# Patient Record
Sex: Female | Born: 2000 | Race: Black or African American | Hispanic: No | Marital: Single | Smoking: Never smoker
Health system: Southern US, Community
[De-identification: ages and names within clinical notes are randomized; demographics above are authoritative.]

## PROBLEM LIST (undated history)

## (undated) HISTORY — PX: ARTHROSCOPIC REPAIR ACL: SUR80

---
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
## 2019-12-09 ENCOUNTER — Encounter (HOSPITAL_BASED_OUTPATIENT_CLINIC_OR_DEPARTMENT_OTHER): Payer: Self-pay | Admitting: Emergency Medicine

## 2019-12-09 ENCOUNTER — Emergency Department (HOSPITAL_BASED_OUTPATIENT_CLINIC_OR_DEPARTMENT_OTHER)
Admission: EM | Admit: 2019-12-09 | Discharge: 2019-12-09 | Disposition: A | Discharge status: Home / Self Care | Payer: Medicaid Other | Attending: Emergency Medicine | Admitting: Emergency Medicine

## 2019-12-09 DIAGNOSIS — N898 Other specified noninflammatory disorders of vagina: Secondary | ICD-10-CM | POA: Insufficient documentation

## 2019-12-09 DIAGNOSIS — R1010 Upper abdominal pain, unspecified: Secondary | ICD-10-CM

## 2019-12-09 DIAGNOSIS — N390 Urinary tract infection, site not specified: Secondary | ICD-10-CM | POA: Diagnosis not present

## 2019-12-09 DIAGNOSIS — R1030 Lower abdominal pain, unspecified: Secondary | ICD-10-CM

## 2019-12-09 LAB — CBC WITH DIFFERENTIAL/PLATELET
Abs Immature Granulocytes: 0.02 10*3/uL (ref 0.00–0.07)
Basophils Absolute: 0 10*3/uL (ref 0.0–0.1)
Basophils Relative: 1 %
Eosinophils Absolute: 0.2 10*3/uL (ref 0.0–0.5)
Eosinophils Relative: 3 %
HCT: 35.3 % — ABNORMAL LOW (ref 36.0–46.0)
Hemoglobin: 11.6 g/dL — ABNORMAL LOW (ref 12.0–15.0)
Immature Granulocytes: 0 %
Lymphocytes Relative: 36 %
Lymphs Abs: 2.8 10*3/uL (ref 0.7–4.0)
MCH: 29.6 pg (ref 26.0–34.0)
MCHC: 32.9 g/dL (ref 30.0–36.0)
MCV: 90.1 fL (ref 80.0–100.0)
Monocytes Absolute: 0.9 10*3/uL (ref 0.1–1.0)
Monocytes Relative: 11 %
Neutro Abs: 3.9 10*3/uL (ref 1.7–7.7)
Neutrophils Relative %: 49 %
Platelets: 248 10*3/uL (ref 150–400)
RBC: 3.92 MIL/uL (ref 3.87–5.11)
RDW: 13 % (ref 11.5–15.5)
WBC: 7.9 10*3/uL (ref 4.0–10.5)
nRBC: 0 % (ref 0.0–0.2)

## 2019-12-09 LAB — URINALYSIS, MICROSCOPIC (REFLEX)

## 2019-12-09 LAB — COMPREHENSIVE METABOLIC PANEL
ALT: 13 U/L (ref 0–44)
AST: 14 U/L — ABNORMAL LOW (ref 15–41)
Albumin: 4.1 g/dL (ref 3.5–5.0)
Alkaline Phosphatase: 29 U/L — ABNORMAL LOW (ref 38–126)
Anion gap: 10 (ref 5–15)
BUN: 12 mg/dL (ref 6–20)
CO2: 24 mmol/L (ref 22–32)
Calcium: 9 mg/dL (ref 8.9–10.3)
Chloride: 104 mmol/L (ref 98–111)
Creatinine, Ser: 0.96 mg/dL (ref 0.44–1.00)
GFR calc Af Amer: >60 mL/min (ref >60)
GFR calc non Af Amer: >60 mL/min (ref >60)
Glucose, Bld: 99 mg/dL (ref 70–99)
Potassium: 3.5 mmol/L (ref 3.5–5.1)
Sodium: 138 mmol/L (ref 135–145)
Total Bilirubin: 0.7 mg/dL (ref 0.3–1.2)
Total Protein: 7 g/dL (ref 6.5–8.1)

## 2019-12-09 LAB — URINALYSIS, ROUTINE W REFLEX MICROSCOPIC
Bilirubin Urine: NEGATIVE
Glucose, UA: NEGATIVE mg/dL
Ketones, ur: NEGATIVE mg/dL
Nitrite: NEGATIVE
Protein, ur: NEGATIVE mg/dL
Specific Gravity, Urine: 1.02 (ref 1.005–1.030)
pH: 6 (ref 5.0–8.0)

## 2019-12-09 LAB — PREGNANCY, URINE: Preg Test, Ur: NEGATIVE

## 2019-12-09 LAB — WET PREP, GENITAL
Clue Cells Wet Prep HPF POC: NONE SEEN
Sperm: NONE SEEN
Trich, Wet Prep: NONE SEEN
Yeast Wet Prep HPF POC: NONE SEEN

## 2019-12-09 LAB — LIPASE, BLOOD: Lipase: 44 U/L (ref 11–51)

## 2019-12-09 MED ORDER — NITROFURANTOIN MONOHYD MACRO 100 MG PO CAPS: 100.0000 mg | ORAL_CAPSULE | Freq: Two times a day (BID) | ORAL | 0 refills | Status: AC

## 2019-12-09 MED ORDER — HYOSCYAMINE SULFATE 0.125 MG SL SUBL
0.2500 mg | SUBLINGUAL_TABLET | Freq: Once | SUBLINGUAL | Status: AC
  Administered 2019-12-09: 0.25 mg via SUBLINGUAL
  Filled 2019-12-09: qty 2

## 2019-12-09 MED ORDER — LIDOCAINE VISCOUS HCL 2 % MT SOLN
15.0000 mL | Freq: Once | OROMUCOSAL | Status: AC
  Administered 2019-12-09: 15 mL via ORAL
  Filled 2019-12-09: qty 15

## 2019-12-09 MED ORDER — SODIUM CHLORIDE 0.9 % IV BOLUS
1000.0000 mL | Freq: Once | INTRAVENOUS | Status: AC
  Administered 2019-12-09: 1000 mL via INTRAVENOUS

## 2019-12-09 MED ORDER — ALUM & MAG HYDROXIDE-SIMETH 200-200-20 MG/5ML PO SUSP
30.0000 mL | Freq: Once | ORAL | Status: AC
  Administered 2019-12-09: 30 mL via ORAL
  Filled 2019-12-09: qty 30

## 2019-12-09 NOTE — ED Triage Notes (Addendum)
Pt states 4 days ago she had to leave work because she was having pain in her knees  Since the pain has radiated up into her back and now is in her abdomen   Pt added that she took a Plan B earlier today

## 2019-12-09 NOTE — ED Notes (Signed)
ED Provider at bedside. 

## 2019-12-09 NOTE — ED Provider Notes (Signed)
[facility] EMERGENCY DEPARTMENT Provider Note  CSN: [id number] Arrival date & time: 12/09/19 0041  Chief Complaint(s) Knee Pain and Abdominal Pain  HPI Brittany Townsend is a 19 y.o. female   CC: abd pain  Onset/Duration: gradual, 3 days Timing: intermittent, fluctuating Location: Upper and lower Quality: Aching/cramping Severity: Mild to moderate Modifying Factors:  Improved by: Self resolves  Worsened by: Movement Associated Signs/Symptoms:  Pertinent (+): Mild nausea  Pertinent (-): Fevers, emesis, diarrhea, constipation, urinary symptoms, vaginal bleeding, discharge. Context: Patient is sexually active with one partner, usually uses protection but most recently did not, prompting her to take Plan B earlier today.  Her pain became worse 6 hours after taking Plan B.  Patient has taken Plan B in the past and has not had abdominal discomfort with it.  During triage she reported having knee pain and back pain which she attributed to her occupation.  This has resolved.   HPI  Past Medical History History reviewed. No pertinent past medical history. There are no problems to display for this patient.  Home Medication(s) Prior to Admission medications   Medication Sig Start Date End Date Taking? Authorizing Provider  nitrofurantoin, macrocrystal-monohydrate, (MACROBID) 100 MG capsule Take 1 capsule (100 mg total) by mouth 2 (two) times daily. 12/09/19   Nira Conn, MD                                                                                                                                    Past Surgical History Past Surgical History:  Procedure Laterality Date  . ARTHROSCOPIC REPAIR ACL     Family History Family History  Problem Relation Age of Onset  . Diabetes Mother     Social History Social History   Tobacco Use  . Smoking status: Never Smoker  . Smokeless tobacco: Never Used  Vaping Use  . Vaping Use: Never used  Substance Use Topics    . Alcohol use: Yes    Comment: social  . Drug use: Not Currently   Allergies Patient has no known allergies.  Review of Systems Review of Systems All other systems are reviewed and are negative for acute change except as noted in the HPI  Physical Exam Vital Signs  I have reviewed the triage vital signs BP 135/75 (BP Location: Left Arm)   Pulse 64   Temp 98.6 F (37 C) (Oral)   Resp 16   Ht 5' 10.5" (1.791 m)   Wt 81.6 kg   LMP 11/29/2019 (Exact Date)   SpO2 100%   BMI 25.46 kg/m   Physical Exam Vitals reviewed. Exam conducted with a chaperone present.  Constitutional:      General: She is not in acute distress.    Appearance: She is well-developed. She is not diaphoretic.  HENT:     Head: Normocephalic and atraumatic.     Right Ear: External ear normal.     Left  Ear: External ear normal.     Nose: Nose normal.  Eyes:     General: No scleral icterus.    Conjunctiva/sclera: Conjunctivae normal.  Neck:     Trachea: Phonation normal.  Cardiovascular:     Rate and Rhythm: Normal rate and regular rhythm.  Pulmonary:     Effort: Pulmonary effort is normal. No respiratory distress.     Breath sounds: No stridor.  Abdominal:     General: There is no distension.     Tenderness: There is abdominal tenderness in the epigastric area, suprapubic area and left upper quadrant. There is no guarding or rebound. Negative signs include Murphy's sign.  Genitourinary:    Vagina: Vaginal discharge (scant, white) present. No erythema, tenderness or lesions.     Cervix: No cervical motion tenderness, discharge, friability, lesion or erythema.     Uterus: Not tender.      Adnexa:        Right: No tenderness.         Left: No tenderness.    Musculoskeletal:        General: Normal range of motion.     Cervical back: Normal range of motion.  Neurological:     Mental Status: She is alert and oriented to person, place, and time.  Psychiatric:        Behavior: Behavior normal.      ED Results and Treatments Labs (all labs ordered are listed, but only abnormal results are displayed) Labs Reviewed  WET PREP, GENITAL - Abnormal; Notable for the following components:      Result Value   WBC, Wet Prep HPF POC MANY (*)    All other components within normal limits  URINALYSIS, ROUTINE W REFLEX MICROSCOPIC - Abnormal; Notable for the following components:   Hgb urine dipstick TRACE (*)    Leukocytes,Ua TRACE (*)    All other components within normal limits  CBC WITH DIFFERENTIAL/PLATELET - Abnormal; Notable for the following components:   Hemoglobin 11.6 (*)    HCT 35.3 (*)    All other components within normal limits  COMPREHENSIVE METABOLIC PANEL - Abnormal; Notable for the following components:   AST 14 (*)    Alkaline Phosphatase 29 (*)    All other components within normal limits  URINALYSIS, MICROSCOPIC (REFLEX) - Abnormal; Notable for the following components:   Bacteria, UA FEW (*)    All other components within normal limits  PREGNANCY, URINE  LIPASE, BLOOD  GC/CHLAMYDIA PROBE AMP ([place]) NOT AT [facility]                                                                                                                         EKG  EKG Interpretation  Date/Time:    Ventricular Rate:    PR Interval:    QRS Duration:   QT Interval:    QTC Calculation:   R Axis:     Text Interpretation:        Radiology No  results found.  Pertinent labs & imaging results that were available during my care of the patient were reviewed by me and considered in my medical decision making (see chart for details).  Medications Ordered in ED Medications  sodium chloride 0.9 % bolus 1,000 mL (1,000 mLs Intravenous New Bag/Given 12/09/19 0140)  alum & mag hydroxide-simeth (MAALOX/MYLANTA) 200-200-20 MG/5ML suspension 30 mL (30 mLs Oral Given 12/09/19 0138)    And  lidocaine (XYLOCAINE) 2 % viscous mouth solution 15 mL (15 mLs Oral Given 12/09/19 0138)  hyoscyamine  (LEVSIN SL) SL tablet 0.25 mg (0.25 mg Sublingual Given 12/09/19 0133)                                                                                                                                    Procedures Procedures  (including critical care time)  Medical Decision Making / ED Course I have reviewed the nursing notes for this encounter and the patient's prior records (if available in EHR or on provided paperwork).   Nayla Dias was evaluated in Emergency Department on 12/09/2019 for the symptoms described in the history of present illness. She was evaluated in the context of the global COVID-19 pandemic, which necessitated consideration that the patient might be at risk for infection with the SARS-CoV-2 virus that causes COVID-19. Institutional protocols and algorithms that pertain to the evaluation of patients at risk for COVID-19 are in a state of rapid change based on information released by regulatory bodies including the CDC and federal and state organizations. These policies and algorithms were followed during the patient's care in the ED.  Upper abdominal discomfort resolved with GI cocktail. Labs are grossly reassuring w/o leukocytosis, evidence of bili obstruction or pancreatitis.  Low abdominal discomfort also improved.  UPT negative.  UA suspicious for possible urinary tract infection versus contamination from vaginal discharge.  No evidence of cervicitis or PID on exam.  Wet prep negative for bacterial vaginosis or trichomonas.  GC/chlamydia sent but have low suspicion for STI.  No empiric treatment for STI necessary at this time.  Will provide patient with a short course of Macrobid for the possible urinary tract infection.      Final Clinical Impression(s) / ED Diagnoses Final diagnoses:  Pain of upper abdomen  Lower abdominal pain  Lower urinary tract infectious disease    The patient appears reasonably screened and/or stabilized for discharge and I doubt any other  medical condition or other [facility] requiring further screening, evaluation, or treatment in the ED at this time prior to discharge. Safe for discharge with strict return precautions.  Disposition: Discharge  Condition: Good  I have discussed the results, Dx and Tx plan with the patient/family who expressed understanding and agree(s) with the plan. Discharge instructions discussed at length. The patient/family was given strict return precautions who verbalized understanding of the instructions. No further questions at time of discharge.    ED Discharge Orders  Ordered    nitrofurantoin, macrocrystal-monohydrate, (MACROBID) 100 MG capsule  2 times daily     Discontinue  Reprint     12/09/19 0226           Follow Up: Primary care provider  Schedule an appointment as soon as possible for a visit  If you do not have a primary care physician, contact [facility] at [phone] for referral     This chart was dictated using voice recognition software.  Despite best efforts to proofread,  errors can occur which can change the documentation meaning.   Nira Connardama, Anastasio Wogan Eduardo, MD 12/09/19 [phone]

## 2019-12-12 LAB — GC/CHLAMYDIA PROBE AMP (~~LOC~~) NOT AT ARMC
Chlamydia: NEGATIVE
Comment: NEGATIVE
Comment: NORMAL
Neisseria Gonorrhea: NEGATIVE

## 2020-11-23 ENCOUNTER — Other Ambulatory Visit (HOSPITAL_COMMUNITY)
Admission: RE | Admit: 2020-11-23 | Discharge: 2020-11-23 | Disposition: A | Discharge status: Home / Self Care | Payer: Medicaid Other | Source: Ambulatory Visit | Attending: Family Medicine | Admitting: Family Medicine

## 2020-11-23 ENCOUNTER — Other Ambulatory Visit: Payer: Self-pay

## 2020-11-23 ENCOUNTER — Emergency Department
Admission: EM | Admit: 2020-11-23 | Discharge: 2020-11-23 | Disposition: A | Discharge status: Home / Self Care | Payer: Medicaid Other | Source: Home / Self Care

## 2020-11-23 DIAGNOSIS — R102 Pelvic and perineal pain: Secondary | ICD-10-CM | POA: Diagnosis present

## 2020-11-23 LAB — POCT URINALYSIS DIP (MANUAL ENTRY)
Bilirubin, UA: NEGATIVE
Blood, UA: NEGATIVE
Glucose, UA: NEGATIVE mg/dL
Ketones, POC UA: NEGATIVE mg/dL
Leukocytes, UA: NEGATIVE
Nitrite, UA: NEGATIVE
Protein Ur, POC: NEGATIVE mg/dL
Spec Grav, UA: 1.02 (ref 1.010–1.025)
Urobilinogen, UA: 0.2 E.U./dL
pH, UA: 7.5 (ref 5.0–8.0)

## 2020-11-23 LAB — POCT URINE PREGNANCY: Preg Test, Ur: NEGATIVE

## 2020-11-23 MED ORDER — ACETAMINOPHEN 325 MG PO TABS
650.0000 mg | ORAL_TABLET | Freq: Once | ORAL | Status: AC
  Administered 2020-11-23: 650 mg via ORAL

## 2020-11-23 MED ORDER — NAPROXEN 375 MG PO TABS: 375.0000 mg | ORAL_TABLET | Freq: Two times a day (BID) | ORAL | 0 refills | Status: AC

## 2020-11-23 NOTE — Discharge Instructions (Addendum)
Your cervical swab is pending this will check for any STD or vaginitis that may be related to your pelvic pain symptoms.  I have included information for you to follow-up and schedule an appointment with the OB/GYN office next-door to our urgent care clinic.  Contact their office on Monday to schedule follow-up.  In the meantime if any of your STD testing warrants treatment we will notify you and I have sent over naproxen for you to take twice daily as needed for pelvic cramping.

## 2020-11-23 NOTE — ED Provider Notes (Signed)
[facility]    CSN: [id number] Arrival date & time: 11/23/20  0272      History   Chief Complaint Chief Complaint  Patient presents with   Pelvic Pain    And cramping    HPI Brittany Townsend is a 20 y.o. female.   HPI Patient presents today with a concern of lower pelvic tenderness.  She reports she experienced pain approximately 3 days ago and subsequently 2 days ago during sexual intercourse.  She reports the pain during sexual intercourse was so severe that she had to discontinue.  She reports that her menstrual cycles have been irregular over the last few months that she occasionally has used Plan B.  She reports her cycle is only lasting approximately 2 to 3 days.  Most recent period was June 8 and lasted 2 to 3 days.  She has not had any recent STD testing however has unprotected sex.  Denies any prior OB/GYN evaluation.  She also endorses urinary frequency and pressure when urinating.  She has not taken any medication for her symptoms.  History reviewed. No pertinent past medical history.  There are no problems to display for this patient.   Past Surgical History:  Procedure Laterality Date   ARTHROSCOPIC REPAIR ACL      OB History   No obstetric history on file.      Home Medications    Prior to Admission medications   Medication Sig Start Date End Date Taking? Authorizing Provider  naproxen (NAPROSYN) 375 MG tablet Take 1 tablet (375 mg total) by mouth 2 (two) times daily. 11/23/20  Yes Bing Neighbors, FNP  nitrofurantoin, macrocrystal-monohydrate, (MACROBID) 100 MG capsule Take 1 capsule (100 mg total) by mouth 2 (two) times daily. 12/09/19   Cardama, Amadeo Garnet, MD    Family History Family History  Problem Relation Age of Onset   Diabetes Mother     Social History Social History   Tobacco Use   Smoking status: Never   Smokeless tobacco: Never  Vaping Use   Vaping Use: Never used  Substance Use Topics   Alcohol use: Yes    Comment:  social   Drug use: Not Currently     Allergies   Patient has no known allergies.   Review of Systems Review of Systems Pertinent negatives listed in HPI    Physical Exam Triage Vital Signs ED Triage Vitals [11/23/20 0946]  Enc Vitals Group     BP 116/72     Pulse Rate (!) 57     Resp 18     Temp 98.7 F (37.1 C)     Temp Source Oral     SpO2 100 %     Weight      Height      Head Circumference      Peak Flow      Pain Score 8     Pain Loc      Pain Edu?      Excl. in GC?    No data found.  Updated Vital Signs BP 116/72 (BP Location: Left Arm)   Pulse (!) 57   Temp 98.7 F (37.1 C) (Oral)   Resp 18   LMP 11/13/2020   SpO2 100%   Visual Acuity Right Eye Distance:   Left Eye Distance:   Bilateral Distance:    Right Eye Near:   Left Eye Near:    Bilateral Near:     Physical Exam Constitutional:  Appearance: Normal appearance.  Cardiovascular:     Rate and Rhythm: Normal rate and regular rhythm.  Pulmonary:     Breath sounds: Normal breath sounds and air entry.  Abdominal:     General: Abdomen is flat.     Tenderness: There is no right CVA tenderness or left CVA tenderness.     Hernia: No hernia is present.       Comments: Lower pelvic tenderness, no rebound tenderness. Pain right > left.   Skin:    General: Skin is warm and dry.     Capillary Refill: Capillary refill takes less than 2 seconds.  Neurological:     General: No focal deficit present.     Mental Status: She is alert.  Psychiatric:        Attention and Perception: Attention normal.        Mood and Affect: Mood normal.        Speech: Speech normal.        Behavior: Behavior normal.        Thought Content: Thought content normal.        Judgment: Judgment normal.      UC Treatments / Results  Labs (all labs ordered are listed, but only abnormal results are displayed) Labs Reviewed  POCT URINE PREGNANCY  POCT URINALYSIS DIP (MANUAL ENTRY)  CERVICOVAGINAL ANCILLARY  ONLY    EKG   Radiology No results found.  Procedures Procedures (including critical care time)  Medications Ordered in UC Medications  acetaminophen (TYLENOL) tablet 650 mg (650 mg Oral Given 11/23/20 1015)    Initial Impression / Assessment and Plan / UC Course  I have reviewed the triage vital signs and the nursing notes.  Pertinent labs & imaging results that were available during my care of the patient were reviewed by me and considered in my medical decision making (see chart for details).    Pelvic pain, UA negative.  Urine culture pending.  Vaginal cytology collected to rule out STD as a source of her pelvic pain.  For now recommend Naprosyn twice daily as needed for pain.  Avoid sexual intercourse until cytology results.  Provided information to follow-up with gynecology here at [facility].  Urine hCG is negative. Return precautions given. Final Clinical Impressions(s) / UC Diagnoses   Final diagnoses:  Pelvic pain in female     Discharge Instructions      Your cervical swab is pending this will check for any STD or vaginitis that may be related to your pelvic pain symptoms.  I have included information for you to follow-up and schedule an appointment with the OB/GYN office next-door to our urgent care clinic.  Contact their office on Monday to schedule follow-up.  In the meantime if any of your STD testing warrants treatment we will notify you and I have sent over naproxen for you to take twice daily as needed for pelvic cramping.   ED Prescriptions     Medication Sig Dispense Auth. Provider   naproxen (NAPROSYN) 375 MG tablet Take 1 tablet (375 mg total) by mouth 2 (two) times daily. 20 tablet Bing Neighbors, FNP      PDMP not reviewed this encounter.   Bing Neighbors, FNP 11/23/20 1221

## 2020-11-23 NOTE — ED Triage Notes (Signed)
Pt c/o pelvic pain/cramping since Wednesday. Urinary frequency and pressure. Pain is intermittent. Pain 8/10

## 2020-11-26 LAB — CERVICOVAGINAL ANCILLARY ONLY
Bacterial Vaginitis (gardnerella): POSITIVE — AB
Candida Glabrata: NEGATIVE
Candida Vaginitis: NEGATIVE
Chlamydia: NEGATIVE
Comment: NEGATIVE
Comment: NEGATIVE
Comment: NEGATIVE
Comment: NEGATIVE
Comment: NEGATIVE
Comment: NORMAL
Neisseria Gonorrhea: NEGATIVE
Trichomonas: NEGATIVE

## 2020-11-29 ENCOUNTER — Telehealth (HOSPITAL_COMMUNITY): Payer: Self-pay | Admitting: Emergency Medicine

## 2020-11-29 MED ORDER — METRONIDAZOLE 500 MG PO TABS: 500.0000 mg | ORAL_TABLET | Freq: Two times a day (BID) | ORAL | 0 refills | Status: AC

## 2022-06-06 ENCOUNTER — Other Ambulatory Visit: Payer: Self-pay

## 2022-06-06 ENCOUNTER — Encounter (HOSPITAL_COMMUNITY): Payer: Self-pay

## 2022-06-06 ENCOUNTER — Emergency Department (HOSPITAL_COMMUNITY)
Admission: EM | Admit: 2022-06-06 | Discharge: 2022-06-06 | Disposition: A | Discharge status: Home / Self Care | Payer: Medicaid Other | Attending: Emergency Medicine | Admitting: Emergency Medicine

## 2022-06-06 DIAGNOSIS — Z20822 Contact with and (suspected) exposure to covid-19: Secondary | ICD-10-CM | POA: Diagnosis not present

## 2022-06-06 DIAGNOSIS — J029 Acute pharyngitis, unspecified: Secondary | ICD-10-CM | POA: Diagnosis not present

## 2022-06-06 LAB — RESP PANEL BY RT-PCR (RSV, FLU A&B, COVID)  RVPGX2
Influenza A by PCR: NEGATIVE
Influenza B by PCR: NEGATIVE
Resp Syncytial Virus by PCR: NEGATIVE
SARS Coronavirus 2 by RT PCR: NEGATIVE

## 2022-06-06 LAB — GROUP A STREP BY PCR: Group A Strep by PCR: NOT DETECTED

## 2022-06-06 LAB — POC URINE PREG, ED: Preg Test, Ur: NEGATIVE

## 2022-06-06 MED ORDER — CEFTRIAXONE SODIUM 1 G IJ SOLR
500.0000 mg | Freq: Once | INTRAMUSCULAR | Status: AC
  Administered 2022-06-06: 500 mg via INTRAMUSCULAR
  Filled 2022-06-06: qty 10

## 2022-06-06 MED ORDER — CEFTRIAXONE SODIUM 1 G IJ SOLR: 1.0000 g | Freq: Once | INTRAMUSCULAR | Status: DC

## 2022-06-06 MED ORDER — LIDOCAINE HCL 1 % IJ SOLN
INTRAMUSCULAR | Status: AC
  Administered 2022-06-06: 2.1 mL
  Filled 2022-06-06: qty 20

## 2022-06-06 NOTE — ED Triage Notes (Signed)
Sore throat on left side x 1 week. Subjective swelling and difficulty swallowing.   Sts it has been going on since performing oral sex.

## 2022-06-06 NOTE — Discharge Instructions (Addendum)
Follow-up in your MyChart account for your test results in the next 3 to 5 days.  Recheck with your doctor if not improving.  Return to the ER for severe or concerning symptoms.  Consider follow-up with the health department for STD testing if desired.  Meantime, you can take Motrin and Tylenol as needed as directed for pain, gargle warm salt water, drink warm tea with honey.

## 2022-06-06 NOTE — ED Provider Notes (Signed)
[facility]-EMERGENCY DEPT Provider Note   CSN: [id number] Arrival date & time: 06/06/22  0447     History  Chief Complaint  Patient presents with   Sore Throat    Brittany Townsend is a 21 y.o. female.  21 year old female presents with sore throat x 1 week. Symptoms started the day after performing oral sex. Denies vaginal symptoms, cough, congestion, fever, sick contacts. Pain is worse with swallowing, radiates to her left ear.        Home Medications Prior to Admission medications   Medication Sig Start Date End Date Taking? Authorizing Provider  metroNIDAZOLE (FLAGYL) 500 MG tablet Take 1 tablet (500 mg total) by mouth 2 (two) times daily. 11/29/20   Merrilee Jansky, MD  naproxen (NAPROSYN) 375 MG tablet Take 1 tablet (375 mg total) by mouth 2 (two) times daily. 11/23/20   Bing Neighbors, FNP  nitrofurantoin, macrocrystal-monohydrate, (MACROBID) 100 MG capsule Take 1 capsule (100 mg total) by mouth 2 (two) times daily. 12/09/19   Nira Conn, MD      Allergies    Patient has no known allergies.    Review of Systems   Review of Systems Negative except as per HPI Physical Exam Updated Vital Signs BP (!) 127/93 (BP Location: Right Arm)   Pulse 64   Temp 97.9 F (36.6 C) (Oral)   Resp 18   Ht 5\' 10"  (1.778 m)   Wt 81.6 kg   SpO2 100%   BMI 25.83 kg/m  Physical Exam Vitals and nursing note reviewed.  Constitutional:      General: She is not in acute distress.    Appearance: She is well-developed. She is not diaphoretic.  HENT:     Head: Normocephalic and atraumatic.     Right Ear: Tympanic membrane and ear canal normal.     Left Ear: Tympanic membrane and ear canal normal.     Nose: No congestion.     Mouth/Throat:     Mouth: Mucous membranes are moist.     Pharynx: Posterior oropharyngeal erythema present. No oropharyngeal exudate.     Tonsils: No tonsillar exudate or tonsillar abscesses. 1+ on the right. 1+ on the left.   Cardiovascular:     Rate and Rhythm: Normal rate and regular rhythm.     Heart sounds: Normal heart sounds.  Pulmonary:     Effort: Pulmonary effort is normal.  Lymphadenopathy:     Cervical: Cervical adenopathy present.     Left cervical: Superficial cervical adenopathy present.  Skin:    General: Skin is warm and dry.     Findings: No erythema or rash.  Neurological:     Mental Status: She is alert and oriented to person, place, and time.  Psychiatric:        Behavior: Behavior normal.     ED Results / Procedures / Treatments   Labs (all labs ordered are listed, but only abnormal results are displayed) Labs Reviewed  RESP PANEL BY RT-PCR (RSV, FLU A&B, COVID)  [id number]  GROUP A STREP BY PCR  POC URINE PREG, ED  GC/CHLAMYDIA PROBE AMP ([place]) NOT AT [facility]    EKG None  Radiology No results found.  Procedures Procedures    Medications Ordered in ED Medications  cefTRIAXone (ROCEPHIN) injection 500 mg (has no administration in time range)    ED Course/ Medical Decision Making/ A&P  Medical Decision Making  21 year old female presents with complaint of sore throat x 1 week, painful with swallowing, radiate towards left ear.  Onset 1 day after performing oral intercourse.  Found to have oropharyngeal erythema without exudate or significant swelling.  She does have tender anterior cervical lymphadenopathy on the left.  TMs are normal.  Given negative viral panel including COVID/flu/ RSV, negative strep, will consider gonococcal pharyngitis, swabbed and treated with Rocephin today.        Final Clinical Impression(s) / ED Diagnoses Final diagnoses:  Pharyngitis, unspecified etiology    Rx / DC Orders ED Discharge Orders     None         Tacy Learn, PA-C 06/06/22 [id number]    Ripley Fraise, MD 06/06/22 [phone]

## 2022-06-09 LAB — GC/CHLAMYDIA PROBE AMP (~~LOC~~) NOT AT ARMC
Chlamydia: NEGATIVE
Comment: NEGATIVE
Comment: NORMAL
Neisseria Gonorrhea: NEGATIVE
